# Patient Record
Sex: Male | Born: 1985 | Race: White | Hispanic: No | Marital: Married | Smoking: Never smoker
Health system: Southern US, Community
[De-identification: ages and names within clinical notes are randomized; demographics above are authoritative.]

## PROBLEM LIST (undated history)

## (undated) DIAGNOSIS — D693 Immune thrombocytopenic purpura: Secondary | ICD-10-CM

---

## 2011-09-29 ENCOUNTER — Emergency Department (HOSPITAL_COMMUNITY): Admission: EM | Admit: 2011-09-29 | Discharge: 2011-09-29 | Payer: PRIVATE HEALTH INSURANCE

## 2016-04-03 ENCOUNTER — Emergency Department (HOSPITAL_COMMUNITY)
Admission: EM | Admit: 2016-04-03 | Discharge: 2016-04-03 | Disposition: A | Discharge status: Home / Self Care | Payer: 59 | Attending: Emergency Medicine | Admitting: Emergency Medicine

## 2016-04-03 ENCOUNTER — Other Ambulatory Visit: Payer: Self-pay

## 2016-04-03 ENCOUNTER — Encounter (HOSPITAL_COMMUNITY): Payer: Self-pay

## 2016-04-03 ENCOUNTER — Emergency Department (HOSPITAL_COMMUNITY): Payer: 59

## 2016-04-03 DIAGNOSIS — R0789 Other chest pain: Secondary | ICD-10-CM | POA: Insufficient documentation

## 2016-04-03 DIAGNOSIS — Z79899 Other long term (current) drug therapy: Secondary | ICD-10-CM | POA: Diagnosis not present

## 2016-04-03 DIAGNOSIS — R112 Nausea with vomiting, unspecified: Secondary | ICD-10-CM | POA: Insufficient documentation

## 2016-04-03 DIAGNOSIS — R079 Chest pain, unspecified: Secondary | ICD-10-CM

## 2016-04-03 HISTORY — DX: Immune thrombocytopenic purpura: D69.3

## 2016-04-03 LAB — CBC
HEMATOCRIT: 48.6 % (ref 39.0–52.0)
HEMOGLOBIN: 17.3 g/dL — AB (ref 13.0–17.0)
MCH: 29.8 pg (ref 26.0–34.0)
MCHC: 35.6 g/dL (ref 30.0–36.0)
MCV: 83.6 fL (ref 78.0–100.0)
Platelets: 221 10*3/uL (ref 150–400)
RBC: 5.81 MIL/uL (ref 4.22–5.81)
RDW: 12.6 % (ref 11.5–15.5)
WBC: 12.4 10*3/uL — ABNORMAL HIGH (ref 4.0–10.5)

## 2016-04-03 LAB — COMPREHENSIVE METABOLIC PANEL
ALK PHOS: 43 U/L (ref 38–126)
ALT: 38 U/L (ref 17–63)
AST: 33 U/L (ref 15–41)
Albumin: 4.4 g/dL (ref 3.5–5.0)
Anion gap: 5 (ref 5–15)
BILIRUBIN TOTAL: 1.1 mg/dL (ref 0.3–1.2)
BUN: 16 mg/dL (ref 6–20)
CALCIUM: 9.7 mg/dL (ref 8.9–10.3)
CHLORIDE: 106 mmol/L (ref 101–111)
CO2: 27 mmol/L (ref 22–32)
CREATININE: 1.12 mg/dL (ref 0.61–1.24)
Glucose, Bld: 117 mg/dL — ABNORMAL HIGH (ref 65–99)
Potassium: 3.8 mmol/L (ref 3.5–5.1)
Sodium: 138 mmol/L (ref 135–145)
TOTAL PROTEIN: 7.6 g/dL (ref 6.5–8.1)

## 2016-04-03 LAB — LIPASE, BLOOD: LIPASE: 28 U/L (ref 11–51)

## 2016-04-03 LAB — TROPONIN I: Troponin I: <0.03 ng/mL (ref <0.031)

## 2016-04-03 MED ORDER — TRAMADOL HCL 50 MG PO TABS: 50.0000 mg | ORAL_TABLET | Freq: Four times a day (QID) | ORAL | Status: AC | PRN

## 2016-04-03 MED ORDER — PANTOPRAZOLE SODIUM 20 MG PO TBEC: 20.0000 mg | DELAYED_RELEASE_TABLET | Freq: Two times a day (BID) | ORAL | Status: AC

## 2016-04-03 MED ORDER — ONDANSETRON 8 MG PO TBDP
8.0000 mg | ORAL_TABLET | Freq: Once | ORAL | Status: DC
  Filled 2016-04-03: qty 1

## 2016-04-03 MED ORDER — SUCRALFATE 1 GM/10ML PO SUSP: 1.0000 g | Freq: Three times a day (TID) | ORAL | Status: AC

## 2016-04-03 MED ORDER — GI COCKTAIL ~~LOC~~
30.0000 mL | Freq: Once | ORAL | Status: AC
  Administered 2016-04-03: 30 mL via ORAL
  Filled 2016-04-03: qty 30

## 2016-04-03 NOTE — ED Notes (Signed)
Pt reports mid sternal chest discomfort that he relates as his acid reflux, taking tums with minimal relief.

## 2016-04-03 NOTE — ED Provider Notes (Signed)
CSN: [id number]     Arrival date & time 04/03/16  [id number] History   First MD Initiated Contact with Patient 04/03/16 [phone]     Chief Complaint  Patient presents with  . Chest Pain     (Consider location/radiation/quality/duration/timing/severity/associated sxs/prior Treatment) HPI Comments: Patient presents to the emergency department for evaluation of chest pain. Patient reports that symptoms began around midnight. He is experiencing constant pain in the center of his chest that is worse while lying down. He did have nausea and vomiting associated with the symptoms. He is not expressing any shortness of breath.  Patient is a 30 y.o. male presenting with chest pain.  Chest Pain   Past Medical History  Diagnosis Date  . ITP (idiopathic thrombocytopenic purpura)    History reviewed. No pertinent past surgical history. No family history on file. Social History  Substance Use Topics  . Smoking status: Never Smoker   . Smokeless tobacco: None  . Alcohol Use: No    Review of Systems  Cardiovascular: Positive for chest pain.  All other systems reviewed and are negative.     Allergies  Penicillins and Sulfa antibiotics  Home Medications   Prior to Admission medications   Medication Sig Start Date End Date Taking? Authorizing Provider  calcium carbonate (TUMS - DOSED IN MG ELEMENTAL CALCIUM) 500 MG chewable tablet Chew 1 tablet by mouth daily.   Yes Historical Provider, MD  pantoprazole (PROTONIX) 20 MG tablet Take 1 tablet (20 mg total) by mouth 2 (two) times daily before a meal. 04/03/16   Gilda Creasehristopher J Habib Kise, MD  sucralfate (CARAFATE) 1 GM/10ML suspension Take 10 mLs (1 g total) by mouth 4 (four) times daily -  with meals and at bedtime. 04/03/16   Gilda Creasehristopher J Danford Tat, MD  traMADol (ULTRAM) 50 MG tablet Take 1 tablet (50 mg total) by mouth every 6 (six) hours as needed. 04/03/16   Gilda Creasehristopher J Joh Rao, MD   BP 132/95 mmHg  Pulse 73  Temp(Src) 98.7 F (37.1 C) (Oral)  Resp  11  Ht 5\' 10"  (1.778 m)  Wt 215 lb (97.523 kg)  BMI 30.85 kg/m2  SpO2 100% Physical Exam  Constitutional: He is oriented to person, place, and time. He appears well-developed and well-nourished. No distress.  HENT:  Head: Normocephalic and atraumatic.  Right Ear: Hearing normal.  Left Ear: Hearing normal.  Nose: Nose normal.  Mouth/Throat: Oropharynx is clear and moist and mucous membranes are normal.  Eyes: Conjunctivae and EOM are normal. Pupils are equal, round, and reactive to light.  Neck: Normal range of motion. Neck supple.  Cardiovascular: Regular rhythm, S1 normal and S2 normal.  Exam reveals no gallop and no friction rub.   No murmur heard. Pulmonary/Chest: Effort normal and breath sounds normal. No respiratory distress. He exhibits no tenderness.  Abdominal: Soft. Normal appearance and bowel sounds are normal. There is no hepatosplenomegaly. There is no tenderness. There is no rebound, no guarding, no tenderness at McBurney's point and negative Murphy's sign. No hernia.  Musculoskeletal: Normal range of motion.  Neurological: He is alert and oriented to person, place, and time. He has normal strength. No cranial nerve deficit or sensory deficit. Coordination normal. GCS eye subscore is 4. GCS verbal subscore is 5. GCS motor subscore is 6.  Skin: Skin is warm, dry and intact. No rash noted. No cyanosis.  Psychiatric: He has a normal mood and affect. His speech is normal and behavior is normal. Thought content normal.  Nursing note and vitals reviewed.  ED Course  Procedures (including critical care time) Labs Review Labs Reviewed  CBC - Abnormal; Notable for the following:    WBC 12.4 (*)    Hemoglobin 17.3 (*)    All other components within normal limits  COMPREHENSIVE METABOLIC PANEL - Abnormal; Notable for the following:    Glucose, Bld 117 (*)    All other components within normal limits  LIPASE, BLOOD  TROPONIN I    Imaging Review Dg Chest 2 View  04/03/2016   CLINICAL DATA:  30 year old male with chest pain EXAM: CHEST  2 VIEW COMPARISON:  None. FINDINGS: The heart size and mediastinal contours are within normal limits. Both lungs are clear. The visualized skeletal structures are unremarkable. IMPRESSION: No active cardiopulmonary disease. Electronically Signed   By: Elgie CollardArash  Radparvar M.D.   On: 04/03/2016 06:36   I have personally reviewed and evaluated these images and lab results as part of my medical decision-making.   EKG Interpretation   Date/Time:  Thursday April 03 2016 06:01:50 EDT Ventricular Rate:  69 PR Interval:    QRS Duration: 98 QT Interval:  373 QTC Calculation: 400 R Axis:   37 Text Interpretation:  Sinus rhythm Normal ECG Confirmed by Raynald Rouillard  MD,  Jamice Carreno ([id number]) on 04/03/2016 6:08:31 AM      MDM   Final diagnoses:  Chest pain, unspecified chest pain type    Patient presents with 6 hours of chest pain that is not related to exertion. His only cardiac risk factors are elevated BMI and early heart disease in father. Initial EKG is normal. Troponin negative.     Gilda Creasehristopher J Maylynn Orzechowski, MD 04/04/16 [phone]

## 2016-04-03 NOTE — Discharge Instructions (Signed)
Nonspecific Chest Pain  °Chest pain can be caused by many different conditions. There is always a chance that your pain could be related to something serious, such as a heart attack or a blood clot in your lungs. Chest pain can also be caused by conditions that are not life-threatening. If you have chest pain, it is very important to follow up with your health care provider. °CAUSES  °Chest pain can be caused by: °· Heartburn. °· Pneumonia or bronchitis. °· Anxiety or stress. °· Inflammation around your heart (pericarditis) or lung (pleuritis or pleurisy). °· A blood clot in your lung. °· A collapsed lung (pneumothorax). It can develop suddenly on its own (spontaneous pneumothorax) or from trauma to the chest. °· Shingles infection (varicella-zoster virus). °· Heart attack. °· Damage to the bones, muscles, and cartilage that make up your chest wall. This can include: °¨ Bruised bones due to injury. °¨ Strained muscles or cartilage due to frequent or repeated coughing or overwork. °¨ Fracture to one or more ribs. °¨ Sore cartilage due to inflammation (costochondritis). °RISK FACTORS  °Risk factors for chest pain may include: °· Activities that increase your risk for trauma or injury to your chest. °· Respiratory infections or conditions that cause frequent coughing. °· Medical conditions or overeating that can cause heartburn. °· Heart disease or family history of heart disease. °· Conditions or health behaviors that increase your risk of developing a blood clot. °· Having had chicken pox (varicella zoster). °SIGNS AND SYMPTOMS °Chest pain can feel like: °· Burning or tingling on the surface of your chest or deep in your chest. °· Crushing, pressure, aching, or squeezing pain. °· Dull or sharp pain that is worse when you move, cough, or take a deep breath. °· Pain that is also felt in your back, neck, shoulder, or arm, or pain that spreads to any of these areas. °Your chest pain may come and go, or it may stay  constant. °DIAGNOSIS °Lab tests or other studies may be needed to find the cause of your pain. Your health care provider may have you take a test called an ambulatory ECG (electrocardiogram). An ECG records your heartbeat patterns at the time the test is performed. You may also have other tests, such as: °· Transthoracic echocardiogram (TTE). During echocardiography, sound waves are used to create a picture of all of the heart structures and to look at how blood flows through your heart. °· Transesophageal echocardiogram (TEE). This is a more advanced imaging test that obtains images from inside your body. It allows your health care provider to see your heart in finer detail. °· Cardiac monitoring. This allows your health care provider to monitor your heart rate and rhythm in real time. °· Holter monitor. This is a portable device that records your heartbeat and can help to diagnose abnormal heartbeats. It allows your health care provider to track your heart activity for several days, if needed. °· Stress tests. These can be done through exercise or by taking medicine that makes your heart beat more quickly. °· Blood tests. °· Imaging tests. °TREATMENT  °Your treatment depends on what is causing your chest pain. Treatment may include: °· Medicines. These may include: °¨ Acid blockers for heartburn. °¨ Anti-inflammatory medicine. °¨ Pain medicine for inflammatory conditions. °¨ Antibiotic medicine, if an infection is present. °¨ Medicines to dissolve blood clots. °¨ Medicines to treat coronary artery disease. °· Supportive care for conditions that do not require medicines. This may include: °¨ Resting. °¨ Applying heat   or cold packs to injured areas. °¨ Limiting activities until pain decreases. °HOME CARE INSTRUCTIONS °· If you were prescribed an antibiotic medicine, finish it all even if you start to feel better. °· Avoid any activities that bring on chest pain. °· Do not use any tobacco products, including  cigarettes, chewing tobacco, or electronic cigarettes. If you need help quitting, ask your health care provider. °· Do not drink alcohol. °· Take medicines only as directed by your health care provider. °· Keep all follow-up visits as directed by your health care provider. This is important. This includes any further testing if your chest pain does not go away. °· If heartburn is the cause for your chest pain, you may be told to keep your head raised (elevated) while sleeping. This reduces the chance that acid will go from your stomach into your esophagus. °· Make lifestyle changes as directed by your health care provider. These may include: °¨ Getting regular exercise. Ask your health care provider to suggest some activities that are safe for you. °¨ Eating a heart-healthy diet. A registered dietitian can help you to learn healthy eating options. °¨ Maintaining a healthy weight. °¨ Managing diabetes, if necessary. °¨ Reducing stress. °SEEK MEDICAL CARE IF: °· Your chest pain does not go away after treatment. °· You have a rash with blisters on your chest. °· You have a fever. °SEEK IMMEDIATE MEDICAL CARE IF:  °· Your chest pain is worse. °· You have an increasing cough, or you cough up blood. °· You have severe abdominal pain. °· You have severe weakness. °· You faint. °· You have chills. °· You have sudden, unexplained chest discomfort. °· You have sudden, unexplained discomfort in your arms, back, neck, or jaw. °· You have shortness of breath at any time. °· You suddenly start to sweat, or your skin gets clammy. °· You feel nauseous or you vomit. °· You suddenly feel light-headed or dizzy. °· Your heart begins to beat quickly, or it feels like it is skipping beats. °These symptoms may represent a serious problem that is an emergency. Do not wait to see if the symptoms will go away. Get medical help right away. Call your local emergency services (911 in the [place] Do not drive yourself to the hospital. °  °This  information is not intended to replace advice given to you by your health care provider. Make sure you discuss any questions you have with your health care provider. °  °Document Released: 07/09/2005 Document Revised: 10/20/2014 Document Reviewed: 05/05/2014 °Elsevier Interactive Patient Education ©2016 [employer] ° °

## 2017-05-30 IMAGING — DX DG CHEST 2V
2 series · 2 of 2 positions shown · non-contrast
Comparison: None.

CLINICAL DATA: 30-year-old male with chest pain

EXAM:
CHEST  2 VIEW

[chest pa]
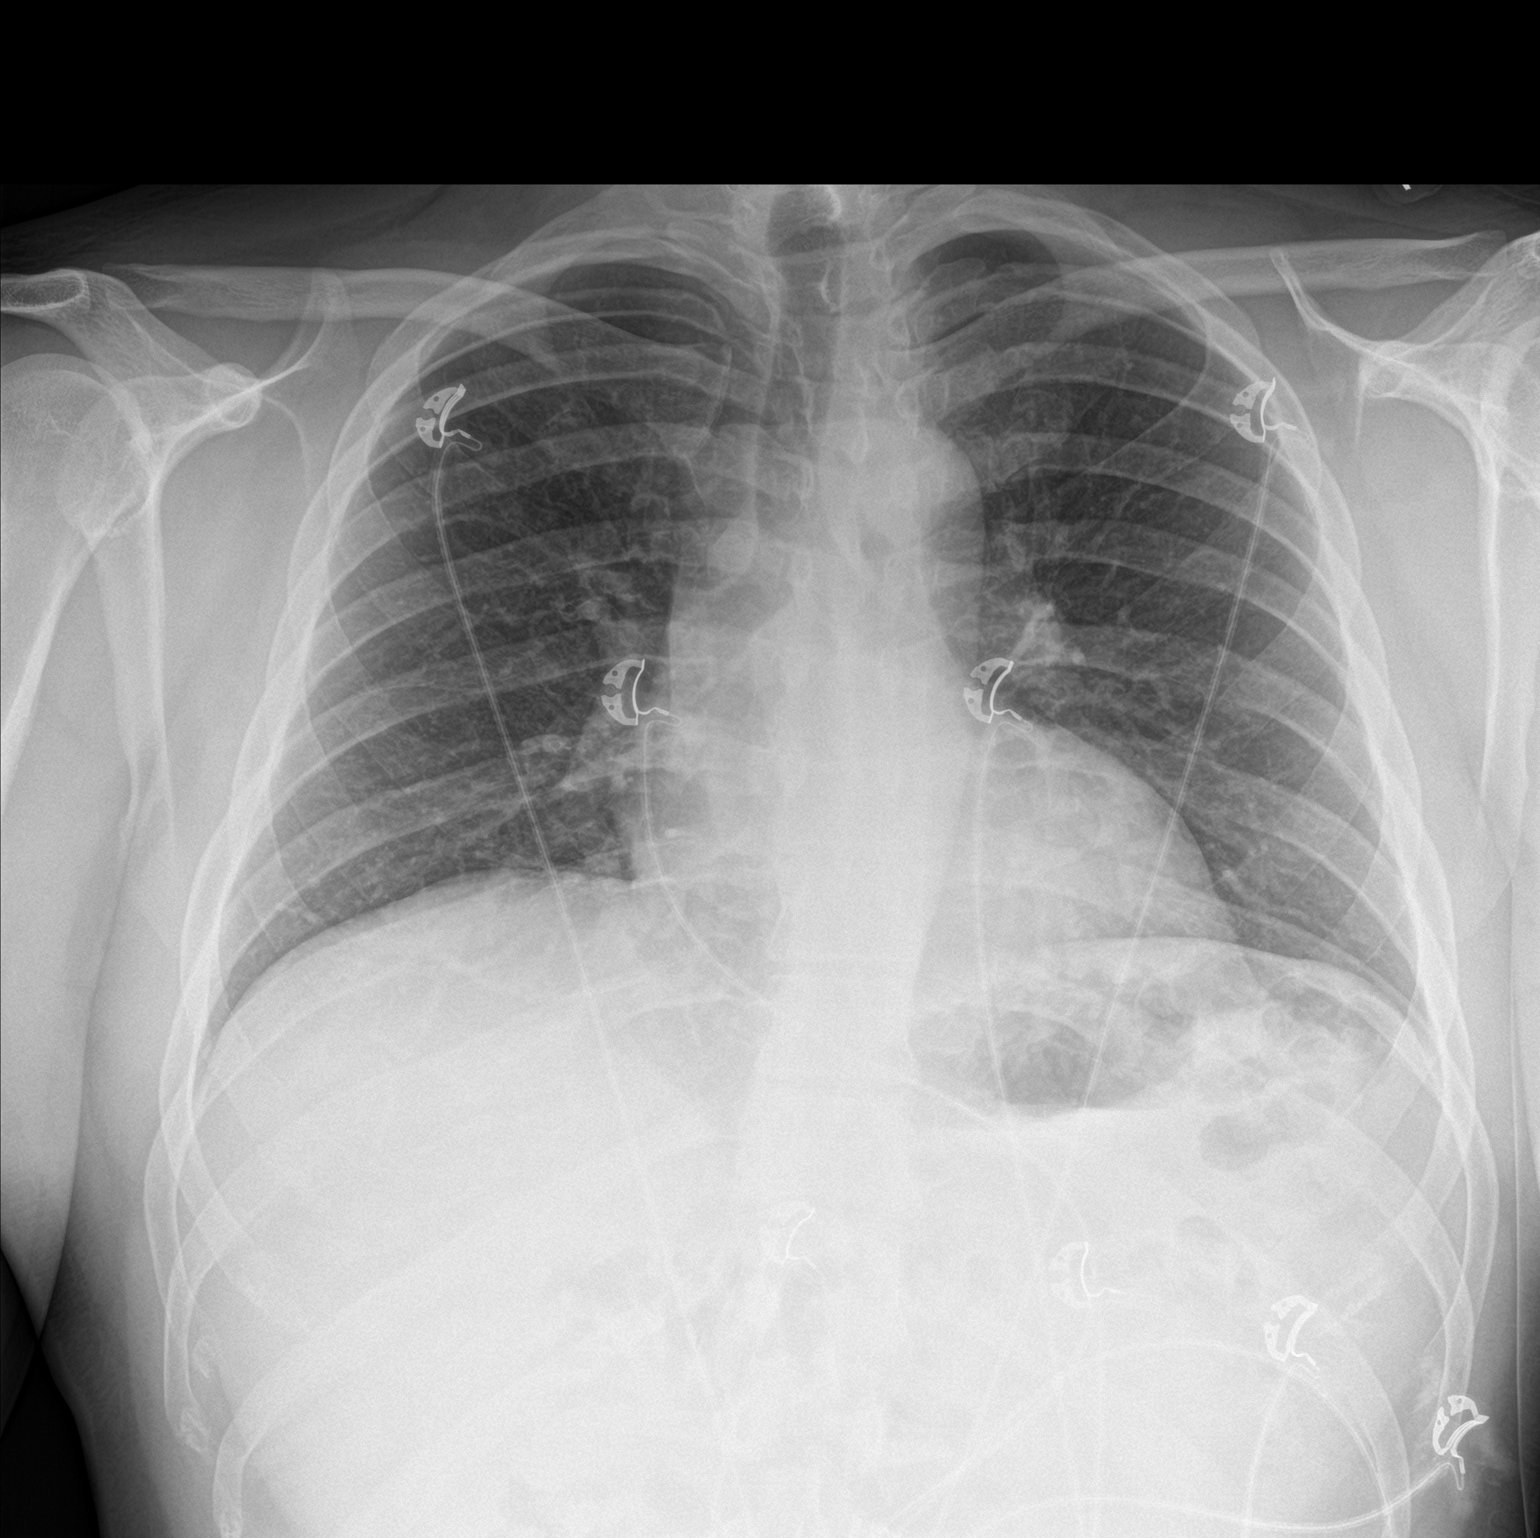

[chest lat]
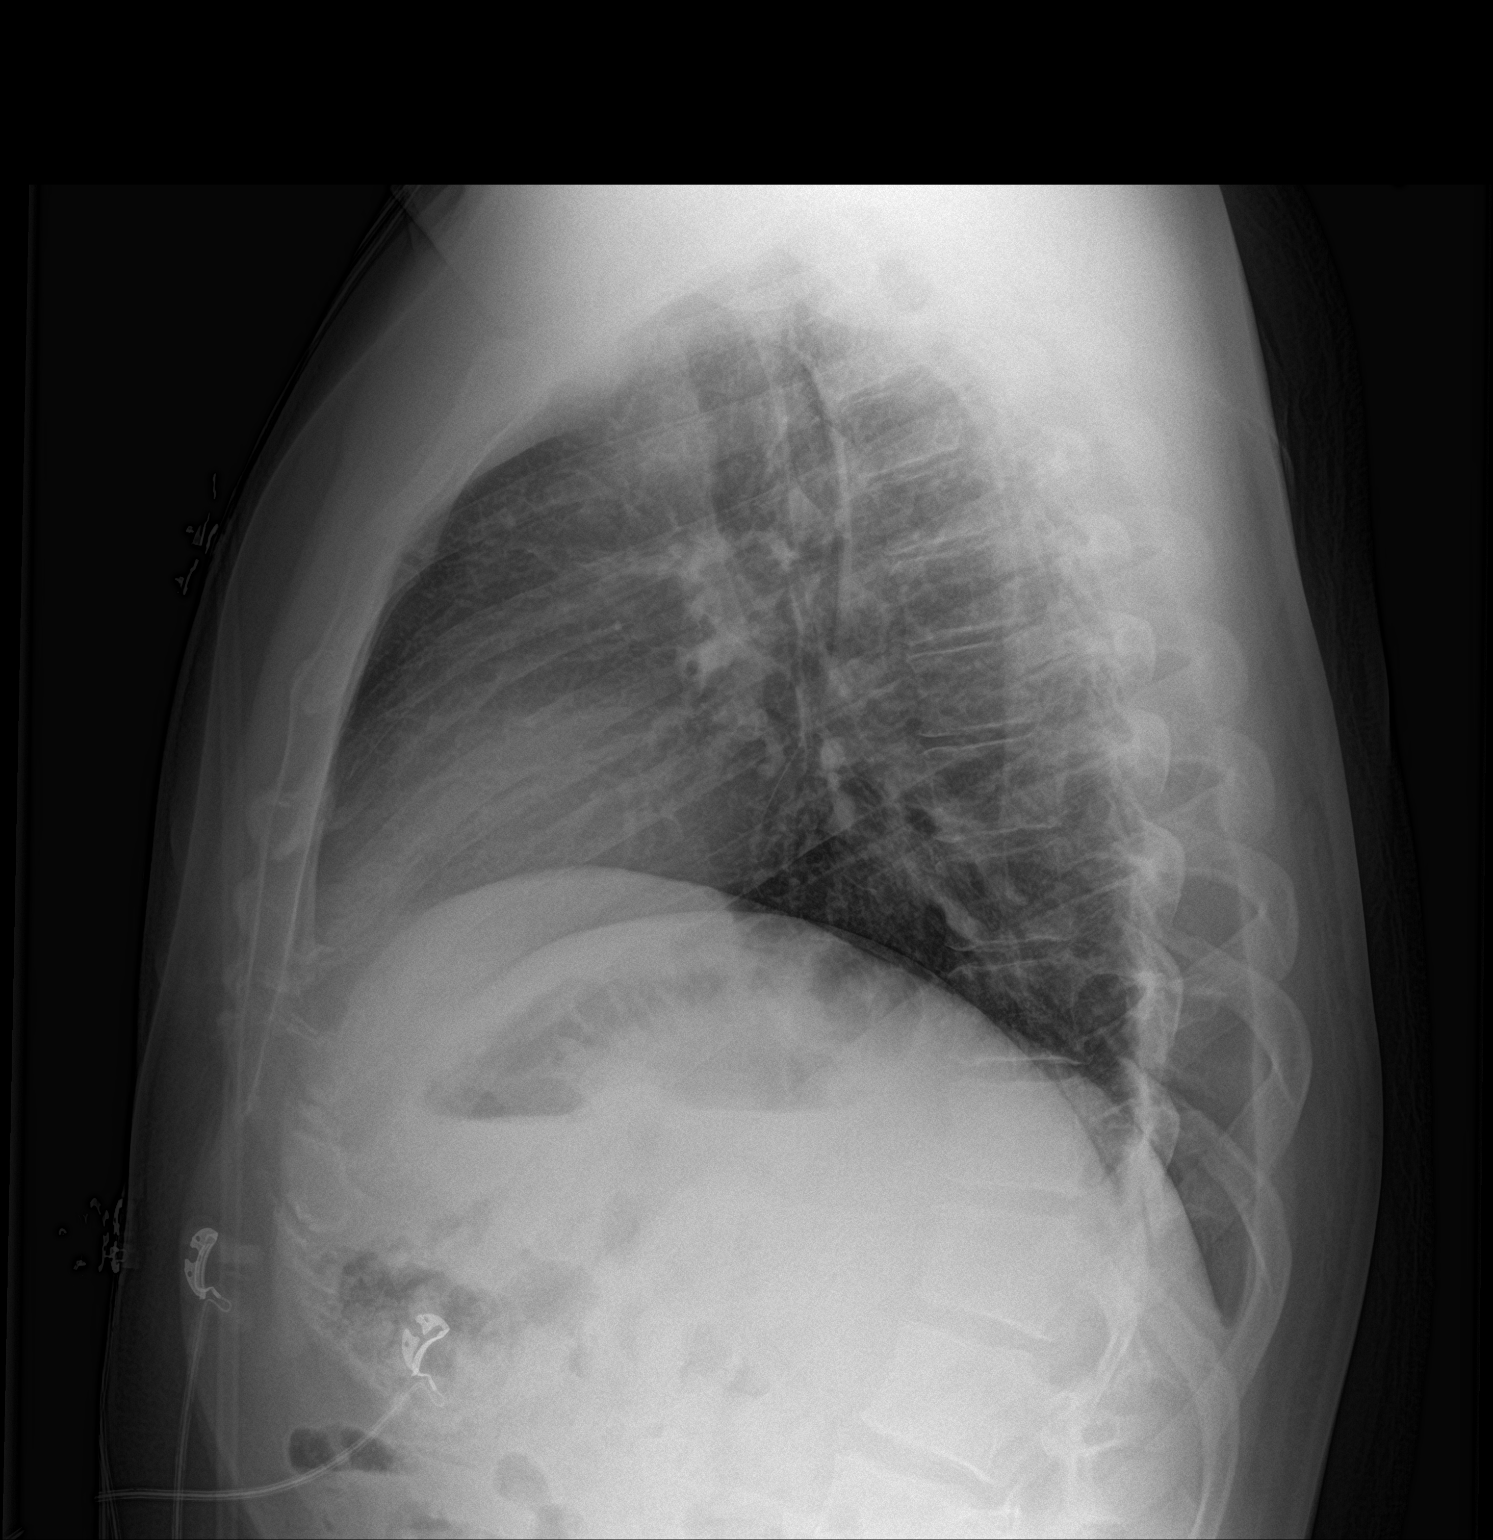

[2 of 2 positions shown; findings below may reference images not displayed]

FINDINGS: The heart size and mediastinal contours are within normal limits.
Both lungs are clear. The visualized skeletal structures are
unremarkable.
IMPRESSION: No active cardiopulmonary disease.

## 2017-10-16 ENCOUNTER — Emergency Department (HOSPITAL_COMMUNITY)
Admission: EM | Admit: 2017-10-16 | Discharge: 2017-10-16 | Disposition: A | Discharge status: Home / Self Care | Payer: 59 | Attending: Emergency Medicine | Admitting: Emergency Medicine

## 2017-10-16 ENCOUNTER — Other Ambulatory Visit: Payer: Self-pay

## 2017-10-16 ENCOUNTER — Emergency Department (HOSPITAL_COMMUNITY): Payer: 59

## 2017-10-16 ENCOUNTER — Encounter (HOSPITAL_COMMUNITY): Payer: Self-pay | Admitting: *Deleted

## 2017-10-16 DIAGNOSIS — R1110 Vomiting, unspecified: Secondary | ICD-10-CM | POA: Diagnosis not present

## 2017-10-16 DIAGNOSIS — R0602 Shortness of breath: Secondary | ICD-10-CM | POA: Insufficient documentation

## 2017-10-16 DIAGNOSIS — R002 Palpitations: Secondary | ICD-10-CM | POA: Insufficient documentation

## 2017-10-16 DIAGNOSIS — Z79899 Other long term (current) drug therapy: Secondary | ICD-10-CM | POA: Insufficient documentation

## 2017-10-16 DIAGNOSIS — R079 Chest pain, unspecified: Secondary | ICD-10-CM | POA: Diagnosis not present

## 2017-10-16 DIAGNOSIS — R072 Precordial pain: Secondary | ICD-10-CM

## 2017-10-16 LAB — CBC
HCT: 48.7 % (ref 39.0–52.0)
HEMOGLOBIN: 16.7 g/dL (ref 13.0–17.0)
MCH: 29 pg (ref 26.0–34.0)
MCHC: 34.3 g/dL (ref 30.0–36.0)
MCV: 84.5 fL (ref 78.0–100.0)
PLATELETS: 245 10*3/uL (ref 150–400)
RBC: 5.76 MIL/uL (ref 4.22–5.81)
RDW: 12.7 % (ref 11.5–15.5)
WBC: 12.4 10*3/uL — AB (ref 4.0–10.5)

## 2017-10-16 LAB — D-DIMER, QUANTITATIVE (NOT AT ARMC): D DIMER QUANT: 0.3 ug{FEU}/mL (ref 0.00–0.50)

## 2017-10-16 LAB — BASIC METABOLIC PANEL
ANION GAP: 10 (ref 5–15)
BUN: 18 mg/dL (ref 6–20)
CHLORIDE: 104 mmol/L (ref 101–111)
CO2: 26 mmol/L (ref 22–32)
CREATININE: 1.21 mg/dL (ref 0.61–1.24)
Calcium: 10 mg/dL (ref 8.9–10.3)
GFR calc non Af Amer: >60 mL/min (ref >60)
Glucose, Bld: 119 mg/dL — ABNORMAL HIGH (ref 65–99)
POTASSIUM: 4.2 mmol/L (ref 3.5–5.1)
SODIUM: 140 mmol/L (ref 135–145)

## 2017-10-16 LAB — I-STAT TROPONIN, ED
TROPONIN I, POC: 0 ng/mL (ref 0.00–0.08)
Troponin i, poc: 0 ng/mL (ref 0.00–0.08)

## 2017-10-16 NOTE — ED Notes (Signed)
Patient's wife and patient states that he is having some mid sternal chest pressure at this time. States pain is 3 out of 10. Ran EKG given to EDP

## 2017-10-16 NOTE — ED Triage Notes (Signed)
Pt states that around 11pm, he was getting ready to go to bed and had onset of heart palpitations that lasted about 30 minutes, associated with SOB. Pt wife says that earlier he was spray painting earlier and there was not good ventilation in the garage, and also he vomited x 1 this evening with a small amount of blood in his emesis.

## 2017-10-16 NOTE — ED Notes (Signed)
Patient states that he feels really tired when he inhales. Hard to take a deep breath.

## 2017-10-16 NOTE — ED Provider Notes (Signed)
[facility] EMERGENCY DEPARTMENT Provider Note   CSN: [id number] Arrival date & time: 10/16/17  0217     History   Chief Complaint Chief Complaint  Patient presents with  . Palpitations    HPI Chad Miranda is a 32 y.o. male.  The history is provided by the patient and the spouse.  Palpitations   This is a new problem. The current episode started 3 to 5 hours ago. The problem occurs constantly. The problem has been gradually improving. Associated symptoms include chest pain, chest pressure, vomiting and shortness of breath. Pertinent negatives include no abdominal pain and no hemoptysis. His past medical history does not include heart disease.  Patient with distant history of ITP, presents with palpitations and chest pressure. He reports last night he had onset of palpitations, with heart racing. No syncope is reported This lasted for at least 1/2-hour, then he began having chest pressure and shortness of breath He now reports that he still is having a hard time breathing  As an aside, he reports he was spray painting in a poorly ventilated garage yesterday and soon after he vomited once with a small amount of blood, but this is resolved and denies hemoptysis  Denies any known history of CAD, or PE He has no other medical conditions Reports he had an episode of palpitations about 2 weeks ago, but this improved  Past Medical History:  Diagnosis Date  . ITP (idiopathic thrombocytopenic purpura)     There are no active problems to display for this patient.   History reviewed. No pertinent surgical history.     Home Medications    Prior to Admission medications   Medication Sig Start Date End Date Taking? Authorizing Provider  calcium carbonate (TUMS - DOSED IN MG ELEMENTAL CALCIUM) 500 MG chewable tablet Chew 1 tablet by mouth daily.    [provider]  pantoprazole (PROTONIX) 20 MG tablet Take 1 tablet (20 mg total) by mouth 2 (two) times daily before a  meal. 04/03/16   Pollina, Canary Brim, MD  sucralfate (CARAFATE) 1 GM/10ML suspension Take 10 mLs (1 g total) by mouth 4 (four) times daily -  with meals and at bedtime. 04/03/16   Gilda Crease, MD  traMADol (ULTRAM) 50 MG tablet Take 1 tablet (50 mg total) by mouth every 6 (six) hours as needed. 04/03/16   Gilda Crease, MD    Family History No family history on file.  Social History Social History   Tobacco Use  . Smoking status: Never Smoker  Substance Use Topics  . Alcohol use: No  . Drug use: Not on file     Allergies   Penicillins and Sulfa antibiotics   Review of Systems Review of Systems  Respiratory: Positive for shortness of breath. Negative for hemoptysis.   Cardiovascular: Positive for chest pain and palpitations.  Gastrointestinal: Positive for vomiting. Negative for abdominal pain.  Neurological: Negative for syncope.  All other systems reviewed and are negative.    Physical Exam Updated Vital Signs BP (!) 132/98 (BP Location: Right Arm)   Pulse 87   Temp 98.3 F (36.8 C) (Oral)   Resp 16   SpO2 98%   Physical Exam CONSTITUTIONAL: Well developed/well nourished HEAD: Normocephalic/atraumatic EYES: EOMI/PERRL ENMT: Mucous membranes moist NECK: supple no meningeal signs SPINE/BACK:entire spine nontender CV: S1/S2 noted, no murmurs/rubs/gallops noted LUNGS: Lungs are clear to auscultation bilaterally, no apparent distress ABDOMEN: soft, nontender, no rebound or guarding, bowel sounds noted throughout abdomen GU:no cva  tenderness NEURO: Pt is awake/alert/appropriate, moves all extremitiesx4.  No facial droop.   EXTREMITIES: pulses normal/equal, full ROM, no calf tenderness or edema SKIN: warm, color normal PSYCH: no abnormalities of mood noted, alert and oriented to situation   ED Treatments / Results  Labs (all labs ordered are listed, but only abnormal results are displayed) Labs Reviewed  BASIC METABOLIC PANEL - Abnormal;  Notable for the following components:      Result Value   Glucose, Bld 119 (*)    All other components within normal limits  CBC - Abnormal; Notable for the following components:   WBC 12.4 (*)    All other components within normal limits  D-DIMER, QUANTITATIVE (NOT AT [facility])  I-STAT TROPONIN, ED  I-STAT TROPONIN, ED    EKG ED ECG REPORT   Date: 10/16/2017 0233  Rate: 90  Rhythm: normal sinus rhythm  QRS Axis: normal  Intervals: normal  ST/T Wave abnormalities: normal  Conduction Disutrbances:none  Narrative Interpretation:    I have personally reviewed the EKG tracing and agree with the computerized printout as noted.  Radiology Dg Chest 2 View  Result Date: 10/16/2017 CLINICAL DATA:  Shortness of breath and palpitations since 11 p.m. on 10/15/2017. History of idiopathic thrombocytopenic purpura. EXAM: CHEST  2 VIEW COMPARISON:  04/03/2016 FINDINGS: The heart size and mediastinal contours are within normal limits. Both lungs are clear. The visualized skeletal structures are unremarkable. IMPRESSION: No active cardiopulmonary disease. Electronically Signed   By: Burman NievesWilliam  Stevens M.D.   On: 10/16/2017 03:23    Procedures Procedures (including critical care time)  Medications Ordered in ED Medications - No data to display   Initial Impression / Assessment and Plan / ED Course  I have reviewed the triage vital signs and the nursing notes.  Pertinent labs & imaging results that were available during my care of the patient were reviewed by me and considered in my medical decision making (see chart for details).     Patient well-appearing, here for palpitations/chest pain.  3 EKGs done and were all unchanged.  2 troponins were negative.  D-dimer negative I have low suspicion for ACS, PE, aortic dissection. No signs of dysrhythmia to cause palpitations Discharge patient home we will refer him to cardiology.  Final Clinical Impressions(s) / ED Diagnoses   Final diagnoses:    Palpitations  Precordial pain    ED Discharge Orders    None       Zadie RhineWickline, Deyvi Bonanno, MD 10/16/17 [phone]

## 2017-10-16 NOTE — ED Notes (Signed)
Additionally had pain in his back in between his shoulder blades.

## 2017-10-16 NOTE — ED Notes (Signed)
Patient up to restroom at this time. Obtain urine specimen. Pain free at this time.

## 2018-12-12 IMAGING — DX DG CHEST 2V
2 series · 2 of 2 positions shown · non-contrast
Comparison: 04/03/2016

CLINICAL DATA: Shortness of breath and palpitations since 11 p.m.
on 10/15/2017. History of idiopathic thrombocytopenic [place].

EXAM:
CHEST  2 VIEW

[chest pa]
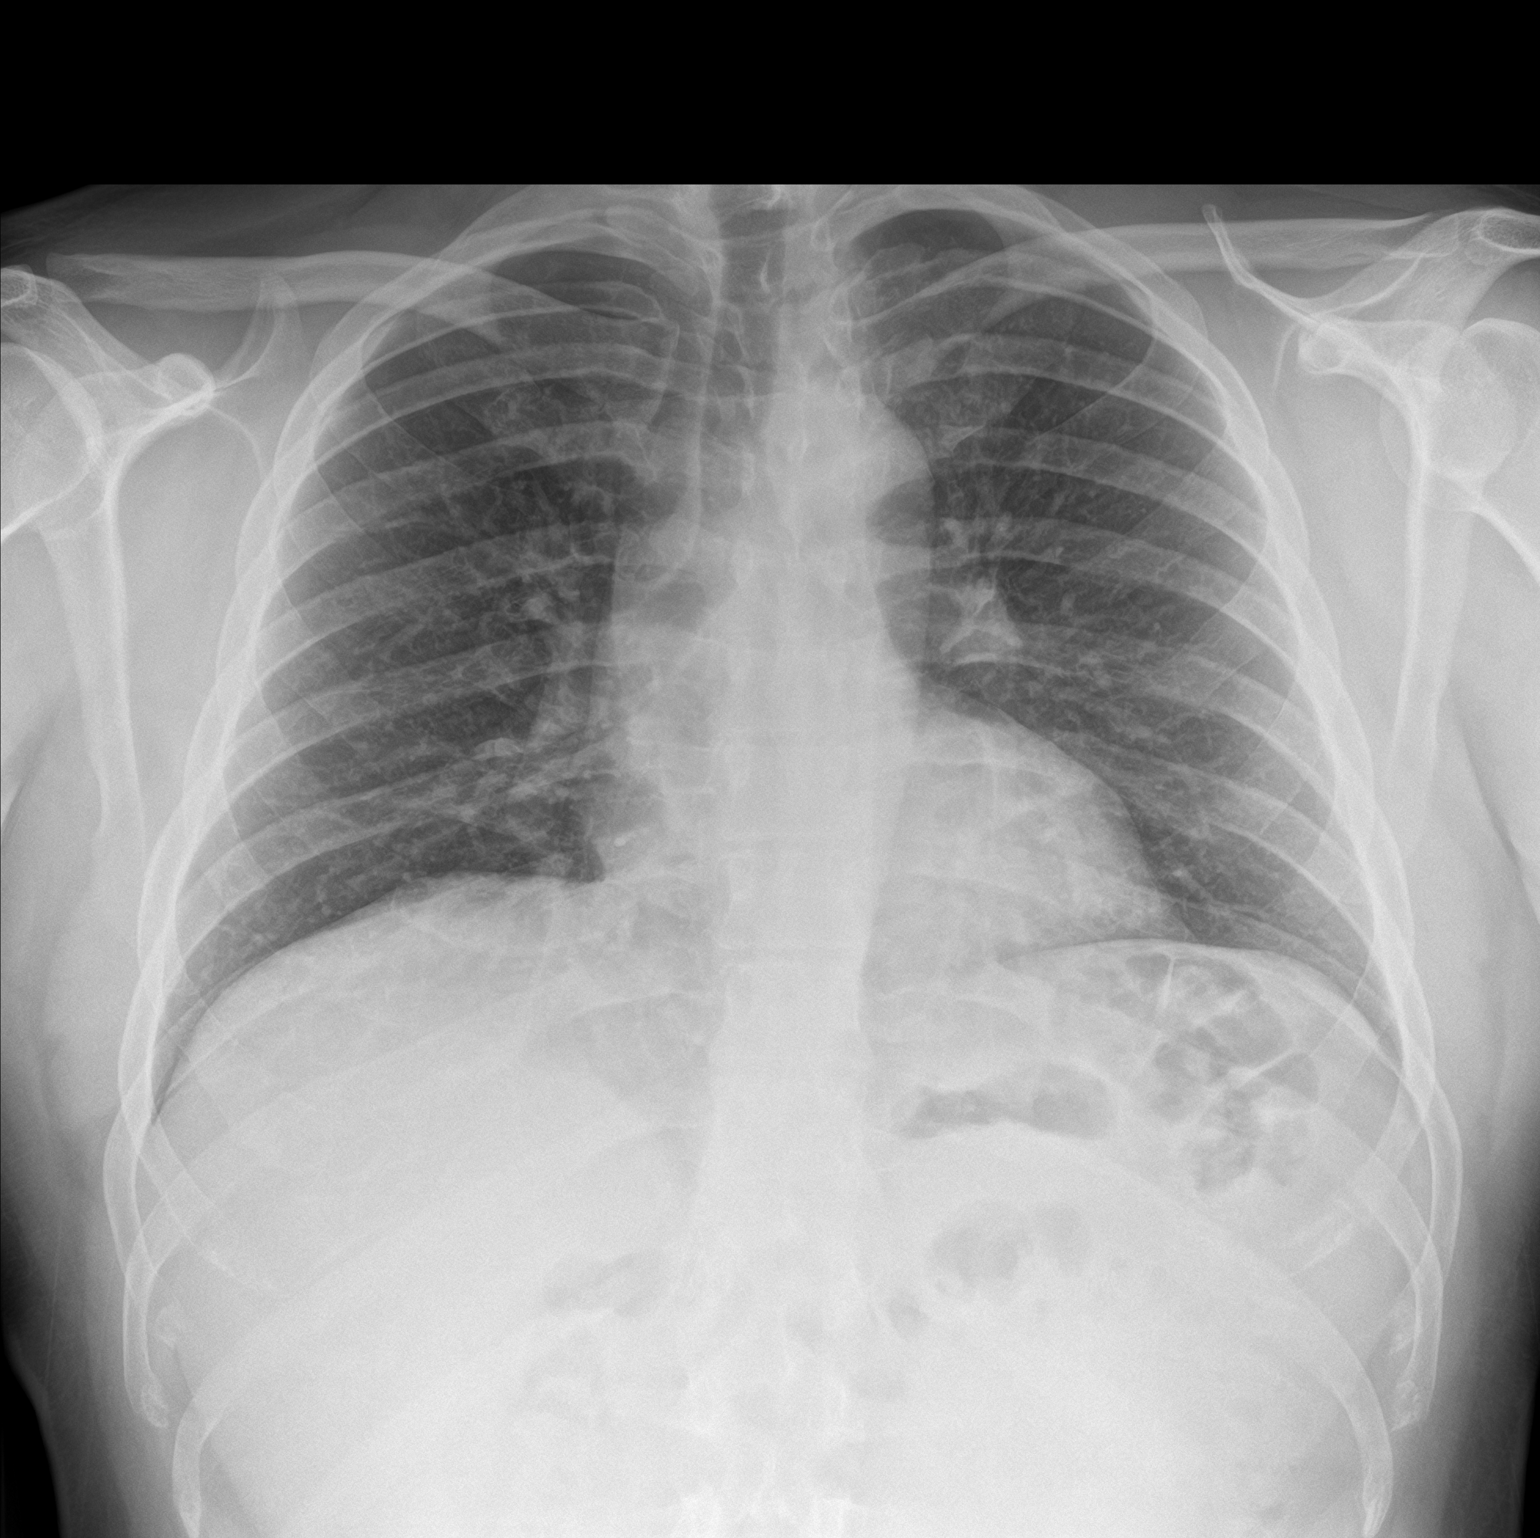

[chest lat]
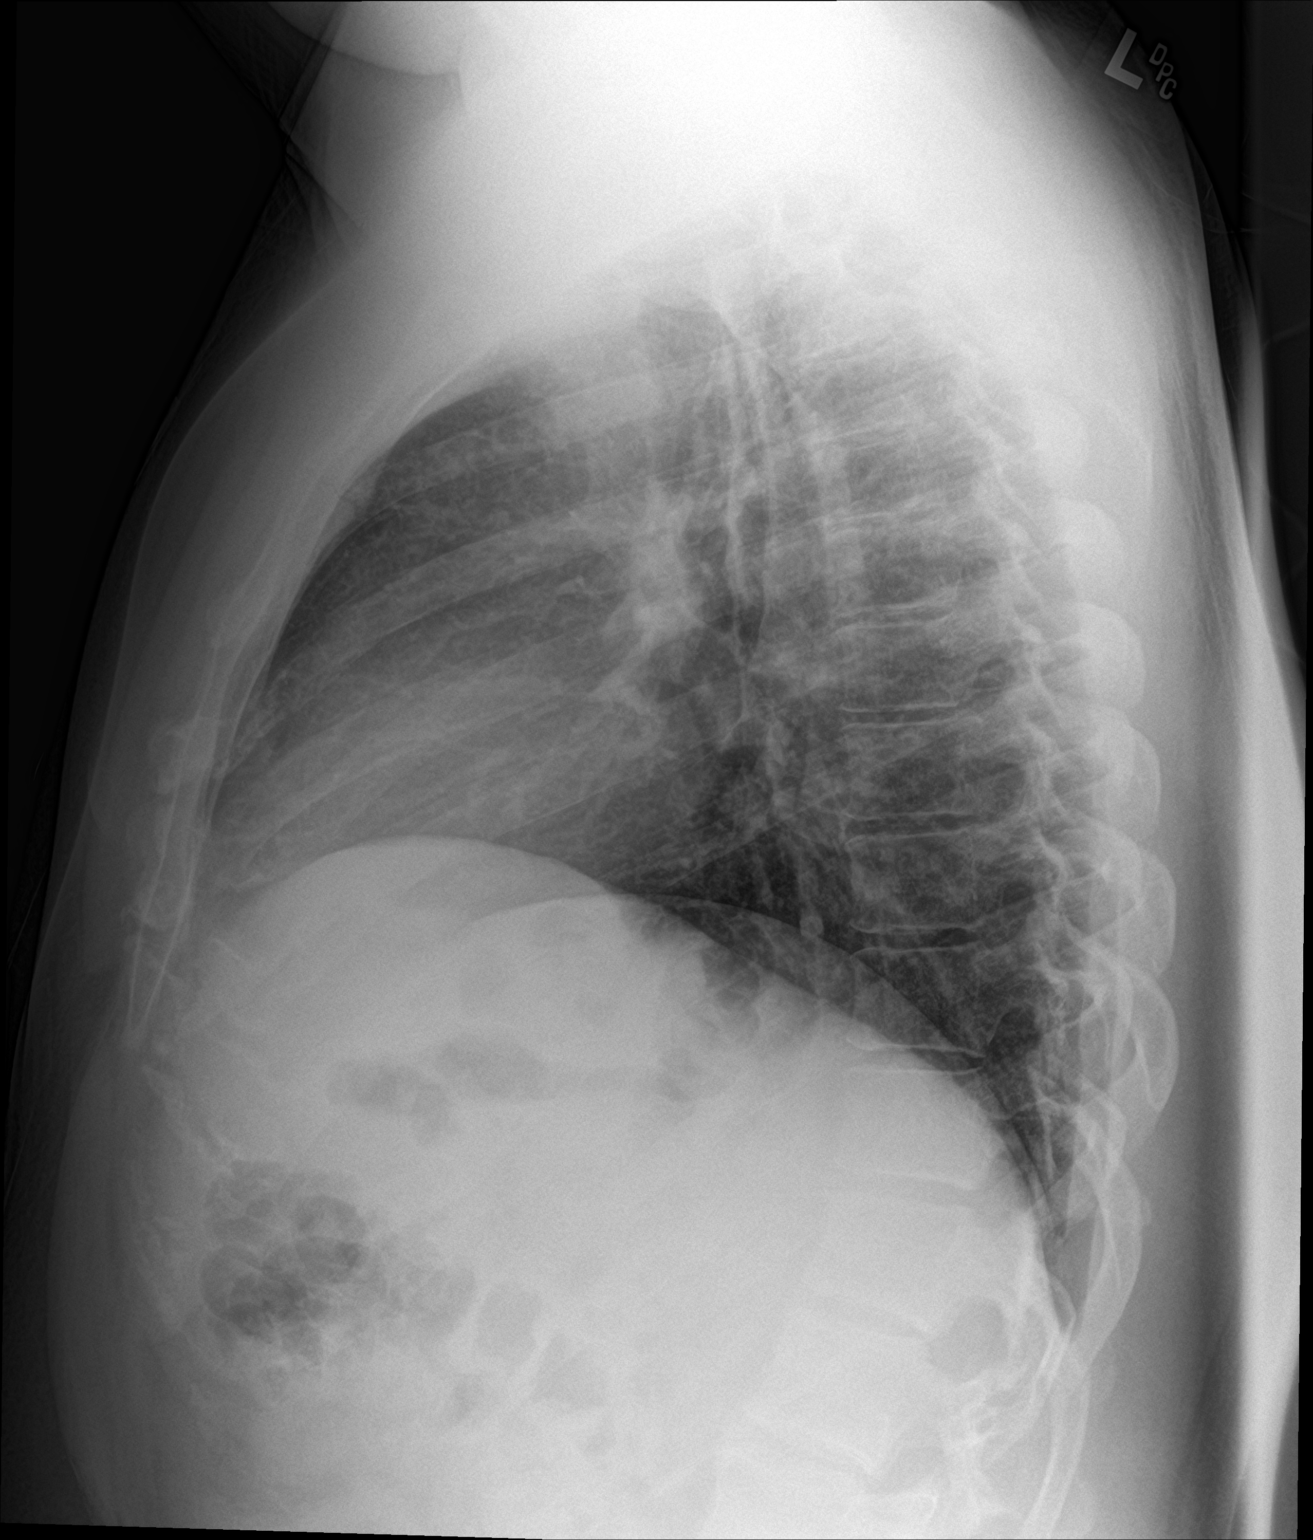

[2 of 2 positions shown; findings below may reference images not displayed]

FINDINGS: The heart size and mediastinal contours are within normal limits.
Both lungs are clear. The visualized skeletal structures are
unremarkable.
IMPRESSION: No active cardiopulmonary disease.

## 2019-03-02 ENCOUNTER — Ambulatory Visit (INDEPENDENT_AMBULATORY_CARE_PROVIDER_SITE_OTHER): Payer: 59 | Admitting: Nurse Practitioner

## 2019-06-28 ENCOUNTER — Ambulatory Visit (INDEPENDENT_AMBULATORY_CARE_PROVIDER_SITE_OTHER): Payer: 59 | Admitting: Internal Medicine

## 2020-06-11 ENCOUNTER — Other Ambulatory Visit: Payer: Self-pay | Admitting: Critical Care Medicine

## 2020-06-11 ENCOUNTER — Other Ambulatory Visit: Payer: 59

## 2020-06-11 DIAGNOSIS — Z20822 Contact with and (suspected) exposure to covid-19: Secondary | ICD-10-CM

## 2020-06-12 LAB — NOVEL CORONAVIRUS, NAA: SARS-CoV-2, NAA: NOT DETECTED
